# Patient Record
Sex: Female | Born: 1960 | Race: White | Hispanic: No | Marital: Married | Smoking: Never smoker
Health system: Southern US, Community
[De-identification: ages and names within clinical notes are randomized; demographics above are authoritative.]

## PROBLEM LIST (undated history)

## (undated) DIAGNOSIS — E039 Hypothyroidism, unspecified: Secondary | ICD-10-CM

---

## 1998-09-28 ENCOUNTER — Other Ambulatory Visit: Admission: RE | Admit: 1998-09-28 | Discharge: 1998-09-28 | Payer: Self-pay | Admitting: Obstetrics and Gynecology

## 2000-05-08 ENCOUNTER — Other Ambulatory Visit: Admission: RE | Admit: 2000-05-08 | Discharge: 2000-05-08 | Payer: Self-pay | Admitting: Obstetrics and Gynecology

## 2001-10-12 ENCOUNTER — Other Ambulatory Visit: Admission: RE | Admit: 2001-10-12 | Discharge: 2001-10-12 | Payer: Self-pay | Admitting: Obstetrics and Gynecology

## 2002-04-28 ENCOUNTER — Ambulatory Visit (HOSPITAL_COMMUNITY): Admission: RE | Admit: 2002-04-28 | Discharge: 2002-04-28 | Payer: Self-pay | Admitting: Obstetrics and Gynecology

## 2002-04-28 ENCOUNTER — Encounter (INDEPENDENT_AMBULATORY_CARE_PROVIDER_SITE_OTHER): Payer: Self-pay

## 2003-02-09 ENCOUNTER — Other Ambulatory Visit: Admission: RE | Admit: 2003-02-09 | Discharge: 2003-02-09 | Payer: Self-pay | Admitting: Obstetrics and Gynecology

## 2003-02-24 ENCOUNTER — Ambulatory Visit (HOSPITAL_COMMUNITY): Admission: RE | Admit: 2003-02-24 | Discharge: 2003-02-24 | Payer: Self-pay | Admitting: Gastroenterology

## 2003-02-24 ENCOUNTER — Encounter: Payer: Self-pay | Admitting: Gastroenterology

## 2003-04-07 ENCOUNTER — Ambulatory Visit (HOSPITAL_COMMUNITY): Admission: RE | Admit: 2003-04-07 | Discharge: 2003-04-07 | Payer: Self-pay | Admitting: Gastroenterology

## 2003-04-07 ENCOUNTER — Encounter: Payer: Self-pay | Admitting: Gastroenterology

## 2003-04-11 ENCOUNTER — Encounter: Payer: Self-pay | Admitting: Gastroenterology

## 2003-04-11 ENCOUNTER — Ambulatory Visit (HOSPITAL_COMMUNITY): Admission: RE | Admit: 2003-04-11 | Discharge: 2003-04-11 | Payer: Self-pay | Admitting: Gastroenterology

## 2004-05-02 ENCOUNTER — Other Ambulatory Visit: Admission: RE | Admit: 2004-05-02 | Discharge: 2004-05-02 | Payer: Self-pay | Admitting: Obstetrics and Gynecology

## 2005-06-28 ENCOUNTER — Ambulatory Visit: Payer: Self-pay | Admitting: Family Medicine

## 2008-05-27 ENCOUNTER — Encounter: Payer: Self-pay | Admitting: *Deleted

## 2010-05-18 ENCOUNTER — Encounter: Admission: RE | Admit: 2010-05-18 | Discharge: 2010-05-18 | Payer: Self-pay | Admitting: Family Medicine

## 2010-07-24 ENCOUNTER — Encounter: Admission: RE | Admit: 2010-07-24 | Discharge: 2010-07-24 | Payer: Self-pay | Admitting: Family Medicine

## 2011-04-05 NOTE — Op Note (Signed)
[facility]  Patient:    Isabella Khan, Isabella Khan Visit Number: [id number] MRN: [id number]          Service Type: DSU Location: [facility] Attending Physician:  Soledad Gerlach Dictated by:   Guy Sandifer Arleta Creek, M.D. Proc. Date: 04/28/02 Admit Date:  04/28/2002                             Operative Report  PREOPERATIVE DIAGNOSIS:       Menometrorrhagia.  POSTOPERATIVE DIAGNOSIS:      Menometrorrhagia.  OPERATION:                    1. Hysteroscopy with resectoscope.                               2. Dilatation and curettage.  SURGEON:                      Guy Sandifer. Arleta Creek, M.D.  ANESTHESIA:                   MAC with 1% Xylocaine paracervical block.  ESTIMATED BLOOD LOSS:         Less than 50 cc.  I&Os:                         Sorbitol distending media, 80 cc deficit.  INDICATIONS AND CONSENT:      The patient is a 50 year old married white female, G2, P2, husband status post vasectomy with increasingly heavy menstrual bleeding.  The details are dictated in the History and Physical. Hysteroscopy with resectoscope, dilatation and curettage have been discussed with the patient.  Potential risks and complications had been discussed preoperatively including, but not limited to infection, uterine perforation or organ damage, bleeding requiring transfusion of blood products with possible transfusion reaction, HIV and hepatitis acquisition, DVT, PE, pneumonia, hysterectomy, laparoscopy, and laparotomy.  All questions have been answered and consent is signed on the chart.  FINDINGS:                     The fallopian tube ostia identified bilaterally. There is a polypoid-type structure which is removed with the hysteroscope.  No other abnormal structures or vessels were noted.  DESCRIPTION OF PROCEDURE:     The patient was taken to the operating room with PAS hose on.  She was then placed in the dorsal supine position where MAC anesthesia has been  induced.  The patient urinated immediately prior to transfer to the operating room.  She was then placed in the dorsolithotomy position where she was prepped and draped in a sterile fashion.  A bivalve speculum was placed in the vagina and the anterior cervical lip was injected with 1% Xylocaine and grasped with a single tooth tenaculum.  Paracervical block was then placed at 2, 4, 5, 7, 8, and 10 oclock positions with approximately 20 cc of 1% Xylocaine.  The cervix actually requires little if any dilation up to a 27 Pratt dilator and a diagnostic hysteroscope was placed in the cervix and advanced under direct visualization using sorbitol distending media.  The above findings were noted.  The hysteroscope was withdrawn.  The cervix was dilated to a 29 Pratt dilator.  The resectoscope was then placed and the polypoid-type structures is resected without  difficulty.  The hysteroscope was then withdrawn and sharp curettage was carried out.  All products were sent to pathology.  The patient was then awakened and taken to the recovery room.  All counts were correct.  The patient was transferred in stable condition. Dictated by:   Guy Sandifer Arleta Creek, M.D. Attending Physician:  Soledad Gerlach DD:  04/28/02 TD:  04/30/02 Job: [id number]

## 2011-04-05 NOTE — H&P (Signed)
[facility]  Patient:    Isabella Khan, Isabella Khan Visit Number: [id number] MRN: [id number]          Service Type: DSU Location: [facility] Attending Physician:  Soledad Gerlach Dictated by:   Guy Sandifer Arleta Creek, M.D. Admit Date:  04/28/2002                           History and Physical  HISTORY OF PRESENT ILLNESS:   This patient is a 50 year old married white female gravida 2, para 2, whose husband is status post vasectomy.  Her menses have become much more predictable.  Sometimes she has extremely heavy flows with clotting.  On examination, the uterus is upper limits of normal size.  On ultrasound on December 02, 2001, in my office, the uterus measured 9.9 x 5.0 x 4.1 cm with a 6.8 mm endometrial stripe.  The right ovary contained normal follicles and the left ovary contained a 14 mm cyst that was hemorrhage.  On sonohistogram, there were two densities noted, one being 5 mm and the other being 2.3 mm in the endometrial cavity.  In addition to anterior wall thickness, endometrium was 3.9 mm.  In view of the continued irregular bleeding and the suggestion of possible abnormalities in the uterine cavity, hysteroscopy and D&C is discussed with the patient.  Potential risks and complications have been discussed preoperatively.  PAST MEDICAL HISTORY:         1. Hypothyroidism, status post partial                                  thyroidectomy.                               2. Bilateral deep venous thromboses during                                  pregnancy in 1996.  PAST SURGICAL HISTORY:        1. Partial thyroidectomy in 1976.                               2. Wisdom teeth extraction in 1996.  OBSTETRIC HISTORY:            Vaginal delivery x2.  MEDICATIONS:                  1. Synthroid 88 mcg per day.                               2. Paxil CR 20 mg per day.                               3. Excedrin p.r.n. headache.                               4.  Calcium and vitamins.  ALLERGIES:                    No known drug allergies.  FAMILY HISTORY:  Positive for migraine headache in the patients mother.  Lung cancer in maternal aunt.  Bone cancer in maternal grandfather and kidney cancer in maternal aunt.  Mitral valve prolapse in maternal grandmother and mother.  Chronic hypertension in mother.  Diabetes in paternal grandmother.  SOCIAL HISTORY:               The patient is an LPN.  She denies tobacco, alcohol, or drug abuse.  REVIEW OF SYSTEMS:            Negative except as above.  PHYSICAL EXAMINATION:  VITAL SIGNS:                  Height 5 feet and 5-1/2 inches.  Weight 126 pounds.  Blood pressure 100/64.  HEENT:                        Without thyromegaly.  LUNGS:                        Clear to auscultation.  HEART:                        Regular rate and rhythm.  BACK:                         Without CVA tenderness.  BREASTS:                      Without masses, retraction, or discharge.  ABDOMEN:                      Soft and nontender without masses.  PELVIC EXAMINATION:           Vulva, vagina, and cervix without lesions. Uterus is anteverted, normal in size, and quite mobile.  There is marked urethrovesical angle mobility and a small cystocele.  Adnexa nontender without masses.  Rectovaginal exam without masses.  EXTREMITIES:                  Grossly within normal limits.  NEUROLOGICAL:                 Exam grossly within normal limits.  ASSESSMENT:                   Menometrorrhagia.  PLAN:                         Hysteroscopy with possible resectoscope, and dilatation and curettage. Dictated by:   Guy Sandifer Arleta Creek, M.D. Attending Physician:  Soledad Gerlach DD:  04/27/02 TD:  04/27/02 Job: [id number]

## 2011-11-05 ENCOUNTER — Other Ambulatory Visit (HOSPITAL_COMMUNITY)
Admission: RE | Admit: 2011-11-05 | Discharge: 2011-11-05 | Disposition: A | Discharge status: Home / Self Care | Payer: 59 | Source: Ambulatory Visit | Attending: Family Medicine | Admitting: Family Medicine

## 2011-11-05 ENCOUNTER — Other Ambulatory Visit: Payer: Self-pay

## 2011-11-05 DIAGNOSIS — Z01419 Encounter for gynecological examination (general) (routine) without abnormal findings: Secondary | ICD-10-CM | POA: Insufficient documentation

## 2012-09-15 ENCOUNTER — Other Ambulatory Visit: Payer: Self-pay | Admitting: Family Medicine

## 2012-09-15 ENCOUNTER — Other Ambulatory Visit (HOSPITAL_COMMUNITY)
Admission: RE | Admit: 2012-09-15 | Discharge: 2012-09-15 | Disposition: A | Discharge status: Home / Self Care | Payer: 59 | Source: Ambulatory Visit | Attending: Family Medicine | Admitting: Family Medicine

## 2012-09-15 DIAGNOSIS — Z0000 Encounter for general adult medical examination without abnormal findings: Secondary | ICD-10-CM | POA: Insufficient documentation

## 2015-10-31 ENCOUNTER — Other Ambulatory Visit (HOSPITAL_COMMUNITY)
Admission: RE | Admit: 2015-10-31 | Discharge: 2015-10-31 | Disposition: A | Discharge status: Home / Self Care | Payer: No Typology Code available for payment source | Source: Ambulatory Visit | Attending: Family Medicine | Admitting: Family Medicine

## 2015-10-31 ENCOUNTER — Other Ambulatory Visit: Payer: Self-pay | Admitting: Family Medicine

## 2015-10-31 DIAGNOSIS — Z01419 Encounter for gynecological examination (general) (routine) without abnormal findings: Secondary | ICD-10-CM | POA: Insufficient documentation

## 2015-11-02 LAB — CYTOLOGY - PAP

## 2018-10-28 ENCOUNTER — Other Ambulatory Visit (HOSPITAL_COMMUNITY)
Admission: RE | Admit: 2018-10-28 | Discharge: 2018-10-28 | Disposition: A | Discharge status: Home / Self Care | Payer: No Typology Code available for payment source | Source: Ambulatory Visit | Attending: Internal Medicine | Admitting: Internal Medicine

## 2018-10-28 ENCOUNTER — Other Ambulatory Visit: Payer: Self-pay | Admitting: Internal Medicine

## 2018-10-28 DIAGNOSIS — Z01411 Encounter for gynecological examination (general) (routine) with abnormal findings: Secondary | ICD-10-CM | POA: Insufficient documentation

## 2018-10-30 LAB — CYTOLOGY - PAP
Diagnosis: NEGATIVE
HPV: NOT DETECTED

## 2019-12-28 DIAGNOSIS — E890 Postprocedural hypothyroidism: Secondary | ICD-10-CM | POA: Diagnosis not present

## 2020-01-22 ENCOUNTER — Ambulatory Visit: Payer: Self-pay | Attending: Internal Medicine

## 2020-01-22 DIAGNOSIS — Z23 Encounter for immunization: Secondary | ICD-10-CM | POA: Insufficient documentation

## 2020-01-22 NOTE — Progress Notes (Signed)
   Covid-19 Vaccination Clinic  Name:  CLAIR BARDWELL    MRN: [id number] DOB: [birth year]  01/22/2020  Ms. Brisbin was observed post Covid-19 immunization for 15 minutes without incident. She was provided with Vaccine Information Sheet and instruction to access the V-Safe system.   Ms. Zabawa was instructed to call 911 with any severe reactions post vaccine: Marland Kitchen Difficulty breathing  . Swelling of face and throat  . A fast heartbeat  . A bad rash all over body  . Dizziness and weakness   Immunizations Administered    Name Date Dose VIS Date Route   Pfizer COVID-19 Vaccine 01/22/2020  1:19 PM 0.3 mL 10/29/2019 Intramuscular   Manufacturer: [employer], [employer]   Lot: [id number]   NDC: [address]-2

## 2020-02-12 ENCOUNTER — Ambulatory Visit: Payer: Self-pay | Attending: Internal Medicine

## 2020-02-12 DIAGNOSIS — Z23 Encounter for immunization: Secondary | ICD-10-CM

## 2020-02-12 NOTE — Progress Notes (Signed)
   Covid-19 Vaccination Clinic  Name:  MAURENE HOLLIN    MRN: [id number] DOB: 22-Apr-1961  02/12/2020  Ms. Reinertsen was observed post Covid-19 immunization for 15 minutes without incident. She was provided with Vaccine Information Sheet and instruction to access the V-Safe system.   Ms. Mian was instructed to call 911 with any severe reactions post vaccine: Marland Kitchen Difficulty breathing  . Swelling of face and throat  . A fast heartbeat  . A bad rash all over body  . Dizziness and weakness   Immunizations Administered    Name Date Dose VIS Date Route   Pfizer COVID-19 Vaccine 02/12/2020 11:18 AM 0.3 mL 10/29/2019 Intramuscular   Manufacturer: [employer], [employer]   Lot: [id number]   NDC: [address]-7

## 2020-02-22 ENCOUNTER — Ambulatory Visit: Payer: Self-pay

## 2020-06-07 DIAGNOSIS — Z13220 Encounter for screening for lipoid disorders: Secondary | ICD-10-CM | POA: Diagnosis not present

## 2020-06-07 DIAGNOSIS — R3129 Other microscopic hematuria: Secondary | ICD-10-CM | POA: Diagnosis not present

## 2020-06-07 DIAGNOSIS — Z0000 Encounter for general adult medical examination without abnormal findings: Secondary | ICD-10-CM | POA: Diagnosis not present

## 2020-06-07 DIAGNOSIS — E890 Postprocedural hypothyroidism: Secondary | ICD-10-CM | POA: Diagnosis not present

## 2020-07-25 DIAGNOSIS — Z20822 Contact with and (suspected) exposure to covid-19: Secondary | ICD-10-CM | POA: Diagnosis not present

## 2021-05-23 DIAGNOSIS — Z20822 Contact with and (suspected) exposure to covid-19: Secondary | ICD-10-CM | POA: Diagnosis not present

## 2021-05-30 DIAGNOSIS — Z1231 Encounter for screening mammogram for malignant neoplasm of breast: Secondary | ICD-10-CM | POA: Diagnosis not present

## 2021-06-13 DIAGNOSIS — E890 Postprocedural hypothyroidism: Secondary | ICD-10-CM | POA: Diagnosis not present

## 2021-06-13 DIAGNOSIS — R002 Palpitations: Secondary | ICD-10-CM | POA: Diagnosis not present

## 2021-06-13 DIAGNOSIS — Z13220 Encounter for screening for lipoid disorders: Secondary | ICD-10-CM | POA: Diagnosis not present

## 2021-06-13 DIAGNOSIS — Z0000 Encounter for general adult medical examination without abnormal findings: Secondary | ICD-10-CM | POA: Diagnosis not present

## 2021-06-13 DIAGNOSIS — M810 Age-related osteoporosis without current pathological fracture: Secondary | ICD-10-CM | POA: Diagnosis not present

## 2021-08-13 DIAGNOSIS — D72819 Decreased white blood cell count, unspecified: Secondary | ICD-10-CM | POA: Diagnosis not present

## 2021-10-14 DIAGNOSIS — J069 Acute upper respiratory infection, unspecified: Secondary | ICD-10-CM | POA: Diagnosis not present

## 2021-10-14 DIAGNOSIS — J208 Acute bronchitis due to other specified organisms: Secondary | ICD-10-CM | POA: Diagnosis not present

## 2021-10-14 DIAGNOSIS — H6122 Impacted cerumen, left ear: Secondary | ICD-10-CM | POA: Diagnosis not present

## 2022-01-08 DIAGNOSIS — Z20822 Contact with and (suspected) exposure to covid-19: Secondary | ICD-10-CM | POA: Diagnosis not present

## 2022-03-19 ENCOUNTER — Emergency Department (HOSPITAL_BASED_OUTPATIENT_CLINIC_OR_DEPARTMENT_OTHER)
Admission: EM | Admit: 2022-03-19 | Discharge: 2022-03-19 | Disposition: A | Discharge status: Home / Self Care | Payer: BC Managed Care – PPO | Attending: Emergency Medicine | Admitting: Emergency Medicine

## 2022-03-19 ENCOUNTER — Encounter (HOSPITAL_BASED_OUTPATIENT_CLINIC_OR_DEPARTMENT_OTHER): Payer: Self-pay | Admitting: Emergency Medicine

## 2022-03-19 ENCOUNTER — Emergency Department (HOSPITAL_BASED_OUTPATIENT_CLINIC_OR_DEPARTMENT_OTHER): Payer: BC Managed Care – PPO | Admitting: Radiology

## 2022-03-19 ENCOUNTER — Other Ambulatory Visit: Payer: Self-pay

## 2022-03-19 DIAGNOSIS — W108XXA Fall (on) (from) other stairs and steps, initial encounter: Secondary | ICD-10-CM | POA: Diagnosis not present

## 2022-03-19 DIAGNOSIS — S82842A Displaced bimalleolar fracture of left lower leg, initial encounter for closed fracture: Secondary | ICD-10-CM | POA: Insufficient documentation

## 2022-03-19 DIAGNOSIS — M7989 Other specified soft tissue disorders: Secondary | ICD-10-CM | POA: Insufficient documentation

## 2022-03-19 DIAGNOSIS — S99912A Unspecified injury of left ankle, initial encounter: Secondary | ICD-10-CM | POA: Diagnosis not present

## 2022-03-19 HISTORY — DX: Hypothyroidism, unspecified: E03.9

## 2022-03-19 MED ORDER — HYDROCODONE-ACETAMINOPHEN 5-325 MG PO TABS: 1.0000 | ORAL_TABLET | ORAL | 0 refills | Status: AC | PRN

## 2022-03-19 MED ORDER — HYDROCODONE-ACETAMINOPHEN 5-325 MG PO TABS
1.0000 | ORAL_TABLET | Freq: Once | ORAL | Status: AC
  Administered 2022-03-19: 1 via ORAL
  Filled 2022-03-19: qty 1

## 2022-03-19 NOTE — Discharge Instructions (Signed)
Keep the leg elevated and do not put any weight on it.  Take the pain medication as prescribed.  Call Dr. [name]' office for an appointment tomorrow after 8 AM.  Return to the ED with worsening pain, weakness, numbness, tingling, other concerns ?

## 2022-03-19 NOTE — ED Provider Notes (Signed)
?[facility] EM[facility] DEPT ?Provider Note ? ? ?CSN: [id number] ?Arrival date & time: 03/19/22  1812 ? ?  ? ?History ? ?Chief Complaint  ?Patient presents with  ? Ankle Injury  ? ? ?Isabella Khan is a 61 y.o. female. ? ?Patient states she injured her left ankle while coming down some stairs and missing the last step.  Her left ankle inverted and she fell to the ground and her elbows.  Not hit her head or lose consciousness.  Complains of pain and swelling to her left ankle diffusely.  No numbness or tingling.  Denies any blood thinner use.  No neck or back pain.  No chest pain shortness of breath or abdominal pain.  Unable to put any weight on her left leg.  No breaks in the skin.  Nothing is numb or tingly ? ?The history is provided by the patient.  ? ?  ? ?Home Medications ?Prior to Admission medications   ?Not on File  ?   ? ?Allergies    ?Patient has no known allergies.   ? ?Review of Systems   ?Review of Systems  ?Constitutional:  Negative for activity change, appetite change and fever.  ?HENT:  Negative for congestion.   ?Respiratory:  Negative for cough, chest tightness and shortness of breath.   ?Cardiovascular:  Negative for chest pain.  ?Gastrointestinal:  Negative for abdominal pain, nausea and vomiting.  ?Genitourinary:  Negative for dysuria and hematuria.  ?Musculoskeletal:  Positive for arthralgias and myalgias.  ?Skin:  Negative for rash.  ?Neurological:  Negative for dizziness, weakness and headaches.  ? all other systems are negative except as noted in the HPI and PMH.  ? ?Physical Exam ?Updated Vital Signs ?BP (!) 139/96 (BP Location: Left Arm)   Pulse 67   Temp 98.3 ?F (36.8 ?C)   Resp 14   Ht 5\' 4"  (1.626 m)   Wt 56.7 kg   SpO2 100%   BMI 21.46 kg/m?  ?Physical Exam ?Vitals and nursing note reviewed.  ?Constitutional:   ?   General: She is not in acute distress. ?   Appearance: She is well-developed.  ?HENT:  ?   Head: Normocephalic and atraumatic.  ?   Mouth/Throat:  ?    Pharynx: No oropharyngeal exudate.  ?Eyes:  ?   Conjunctiva/sclera: Conjunctivae normal.  ?   Pupils: Pupils are equal, round, and reactive to light.  ?Neck:  ?   Comments: No meningismus. ?Cardiovascular:  ?   Rate and Rhythm: Normal rate and regular rhythm.  ?   Heart sounds: Normal heart sounds. No murmur heard. ?Pulmonary:  ?   Effort: Pulmonary effort is normal. No respiratory distress.  ?   Breath sounds: Normal breath sounds.  ?Abdominal:  ?   Palpations: Abdomen is soft.  ?   Tenderness: There is no abdominal tenderness. There is no guarding or rebound.  ?Musculoskeletal:     ?   General: Swelling, tenderness and deformity present.  ?   Cervical back: Normal range of motion and neck supple.  ?   Comments: Diffuse swelling of left ankle.  Compartments are soft.  Range of motion diminished due to pain.  Able to wiggle toes.  Intact DP and PT pulse.  No proximal fibular tenderness  ?Skin: ?   General: Skin is warm.  ?Neurological:  ?   Mental Status: She is alert and oriented to person, place, and time.  ?   Cranial Nerves: No cranial nerve deficit.  ?  Motor: No abnormal muscle tone.  ?   Coordination: Coordination normal.  ?   Comments:  5/5 strength throughout. CN 2-12 intact.Equal grip strength.   ?Psychiatric:     ?   Behavior: Behavior normal.  ? ? ?ED Results / Procedures / Treatments   ?Labs ?(all labs ordered are listed, but only abnormal results are displayed) ?Labs Reviewed - No data to display ? ?EKG ?None ? ?Radiology ?DG Ankle Complete Left ? ?Result Date: 03/19/2022 ?CLINICAL DATA:  Swelling, twisted ankle. EXAM: LEFT ANKLE COMPLETE - 3+ VIEW COMPARISON:  None. FINDINGS: There is an acute oblique fracture through the medial malleolus, nondisplaced. There is also likely a nondisplaced acute fracture through the distal fibula just above the level of the ankle mortise. Joint spaces are well maintained. There is marked soft tissue swelling surrounding the ankle. IMPRESSION: 1. Acute nondisplaced  medial malleolar fracture. 2. Likely acute nondisplaced fracture of the distal fibula. Electronically Signed   By: Darliss Cheney M.D.   On: 03/19/2022 19:08   ? ?Procedures ?Procedures  ? ? ?Medications Ordered in ED ?Medications  ?HYDROcodone-acetaminophen (NORCO/VICODIN) 5-325 MG per tablet 1 tablet (1 tablet Oral Given 03/19/22 2213)  ? ? ?ED Course/ Medical Decision Making/ A&P ?  ?                        ?Medical Decision Making ?Amount and/or Complexity of Data Reviewed ?Radiology: ordered. ? ?Risk ?Prescription drug management. ? ? ?Mechanical fall with left ankle pain.  No head injury.  Neurovascularly intact. ? ?Xray is consistent with nondisplaced bimalleolar fracture. ?Results reviewed and interpreted by me ? ?Discussed with Dr. [name] of orthopedics.  He agrees with splinting as is, splint, ice, elevation and crutches.  He will see the patient after 8 AM tomorrow in the office.  She will call for an appointment ? ?Discussed with patient and husband.  Call orthopedics for an appointment in the morning.  Return precautions given ? ? ? ? ? ? ? ?Final Clinical Impression(s) / ED Diagnoses ?Final diagnoses:  ?Bimalleolar ankle fracture, left, closed, initial encounter  ? ? ?Rx / DC Orders ?ED Discharge Orders   ? ? None  ? ?  ? ? ?  ?Glynn Octave, MD ?03/19/22 2314 ? ?

## 2022-03-19 NOTE — ED Triage Notes (Signed)
Pt arrives to ED with c/o left sided ankle injury that occurred today. Pt reports she landed on her ankle sideways after missing a step.  ?

## 2022-03-20 ENCOUNTER — Telehealth: Payer: Self-pay | Admitting: Radiology

## 2022-03-20 NOTE — Telephone Encounter (Signed)
Patient left voicemail on triage line stating she was seen in ED last night for ankle fracture and needed appointment to see Dr. [name] today. I spoke with Dr. [name] who advised to continue NWB, elevate, and put patient on schedule for Friday. I called patient and advised. Appointment scheduled for Friday afternoon. ?

## 2022-03-22 ENCOUNTER — Encounter: Payer: Self-pay | Admitting: Orthopaedic Surgery

## 2022-03-22 ENCOUNTER — Ambulatory Visit: Payer: BC Managed Care – PPO | Admitting: Orthopaedic Surgery

## 2022-03-22 DIAGNOSIS — S8253XA Displaced fracture of medial malleolus of unspecified tibia, initial encounter for closed fracture: Secondary | ICD-10-CM | POA: Insufficient documentation

## 2022-03-22 DIAGNOSIS — S8255XA Nondisplaced fracture of medial malleolus of left tibia, initial encounter for closed fracture: Secondary | ICD-10-CM

## 2022-03-22 NOTE — Progress Notes (Signed)
? ?Office Visit Note ?  ?Patient: Isabella Khan           ?Date of Birth: 1960/12/31           ?MRN: [id number] ?Visit Date: 03/22/2022 ?             ?Requested by: [name], MD ?[address] ?[place],  [place] [address] ?PCP: [name], MD ? ? ?Assessment & Plan: ?Visit Diagnoses: Nondisplaced closed left medial malleolar ankle fracture ? ?Plan: Patient will return in 1 week for short leg fiberglass cast application in neutral position and will remain nonweightbearing.  She will follow-up with Dr. [name] in 5 weeks for cast off x-rays out of cast and likely cam boot.  I discussed with her I do not see a definite fracture of the lateral malleolus.Global.  ? ?Follow-Up Instructions: No follow-ups on file.  ? ?Orders:  ?No orders of the defined types were placed in this encounter. ? ?No orders of the defined types were placed in this encounter. ? ? ? ? Procedures: ?No procedures performed ? ? ?Clinical Data: ?No additional findings. ? ? ?Subjective: ?Chief Complaint  ?Patient presents with  ? Left Ankle - Pain  ?  Fall 03/19/2022  ? ? ?HPI 61 year old female had a fall 03/19/2022 with injury to left ankle inability to ambulate.  She missed the last step coming down when she was talking with her husband.  She has had ibuprofen also some Percocet.  Pain medicine sparingly.  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  She has had some swelling she has crutches some difficulty with crutches and is looking into a knee roller. ? ?Review of Systems all other systems noncontributory to HPI. ? ? ?Objective: ?Vital Signs: BP 121/76   Pulse 69   Ht 5\' 4"  (1.626 m)   Wt 125 lb (56.7 kg)   BMI 21.46 kg/m?  ? ?Physical Exam ?Constitutional:   ?   Appearance: She is well-developed.  ?HENT:  ?   Head: Normocephalic.  ?   Right Ear: External ear normal.  ?   Left Ear: External ear normal. There is no impacted cerumen.  ?Eyes:  ?   Pupils: Pupils are equal, round, and reactive to light.  ?Neck:  ?   Thyroid:  No thyromegaly.  ?   Trachea: No tracheal deviation.  ?Cardiovascular:  ?   Rate and Rhythm: Normal rate.  ?Pulmonary:  ?   Effort: Pulmonary effort is normal.  ?Abdominal:  ?   Palpations: Abdomen is soft.  ?Musculoskeletal:  ?   Cervical back: No rigidity.  ?Skin: ?   General: Skin is warm and dry.  ?Neurological:  ?   Mental Status: She is alert and oriented to person, place, and time.  ?Psychiatric:     ?   Behavior: Behavior normal.  ? ? ?Ortho Exam patient is a well-padded splint proximal portion had moleskin added since it was rubbing some into her proximal calf posteriorly.  Sensation of the foot is intact. ? ?Specialty Comments:  ?No specialty comments available. ? ?Imaging: ?Narrative & Impression  ?CLINICAL DATA:  Swelling, twisted ankle. ?  ?EXAM: ?LEFT ANKLE COMPLETE - 3+ VIEW ?  ?COMPARISON:  None. ?  ?FINDINGS: ?There is an acute oblique fracture through the medial malleolus, ?nondisplaced. ?  ?There is also likely a nondisplaced acute fracture through the ?distal fibula just above the level of the ankle mortise. ?  ?Joint spaces are well maintained. There is marked soft tissue ?  swelling surrounding the ankle. ?  ?IMPRESSION: ?1. Acute nondisplaced medial malleolar fracture. ?2. Likely acute nondisplaced fracture of the distal fibula. ?  ?  ?Electronically Signed ?  By: Darliss Cheney M.D. ?  On: 03/19/2022 19:08  ? ? ? ?PMFS History: ?Patient Active Problem List  ? Diagnosis Date Noted  ? Fracture of medial malleolus 03/22/2022  ? ?Past Medical History:  ?Diagnosis Date  ? Hypothyroid   ?  ?No family history on file.  ?No past surgical history on file. ?Social History  ? ?Occupational History  ? Not on file  ?Tobacco Use  ? Smoking status: Never  ? Smokeless tobacco: Never  ?Substance and Sexual Activity  ? Alcohol use: Not on file  ? Drug use: Not on file  ? Sexual activity: Not on file  ? ? ? ? ? ? ?

## 2022-03-29 ENCOUNTER — Ambulatory Visit (INDEPENDENT_AMBULATORY_CARE_PROVIDER_SITE_OTHER): Payer: BC Managed Care – PPO | Admitting: Radiology

## 2022-03-29 ENCOUNTER — Encounter: Payer: Self-pay | Admitting: Radiology

## 2022-03-29 VITALS — Ht 64.0 in | Wt 125.0 lb

## 2022-03-29 DIAGNOSIS — S8255XA Nondisplaced fracture of medial malleolus of left tibia, initial encounter for closed fracture: Secondary | ICD-10-CM

## 2022-03-29 NOTE — Progress Notes (Signed)
Patient returns for short leg cast application. She continues to have swelling in her foot and toes. Short leg cast applied. Advised to continue elevation with the leg higher than the heart. Patient to follow up in the office in 5 weeks per Dr. [name] office note. She will call if problems or concerns prior to that visit. ?

## 2022-05-07 ENCOUNTER — Ambulatory Visit: Payer: BC Managed Care – PPO | Admitting: Orthopaedic Surgery

## 2022-05-07 ENCOUNTER — Encounter: Payer: Self-pay | Admitting: Orthopaedic Surgery

## 2022-05-07 ENCOUNTER — Ambulatory Visit: Payer: Self-pay

## 2022-05-07 VITALS — BP 111/72 | HR 66 | Ht 64.0 in | Wt 125.0 lb

## 2022-05-07 DIAGNOSIS — S8255XA Nondisplaced fracture of medial malleolus of left tibia, initial encounter for closed fracture: Secondary | ICD-10-CM | POA: Diagnosis not present

## 2022-05-07 NOTE — Progress Notes (Signed)
   Post-Op Visit Note   Patient: Isabella Khan           Date of Birth: 1961/08/09           MRN: [id number] Visit Date: 05/07/2022 PCP: [name], MD   Assessment & Plan: Cast removal follow-up nondisplaced medial malleolar fracture.  There is questionable fibular fracture but I do not see any changes and I think she only had a medial malleolar fracture although she continues have some discomfort and tenderness laterally over the fibula.  She can stand and weight-bear with her crutches without significant pain we will place her in a cam boot and she can begin progressive weightbearing starting at 25 to 50% and progress as tolerated.  She has a trip to [place] planned in September and her goal is to be able to walk all the activities on her trip as planned.  Recheck 4 weeks.  No x-ray needed on return.  X-rays show good healing of the medial malleolar fracture.  Chief Complaint:  Chief Complaint  Patient presents with   Left Ankle - Fracture, Follow-up   Visit Diagnoses:  1. Closed nondisplaced fracture of medial malleolus of left tibia, initial encounter     Plan:   Follow-Up Instructions: Return in about 4 weeks (around 06/04/2022).   Orders:  Orders Placed This Encounter  Procedures   XR Ankle Complete Left   No orders of the defined types were placed in this encounter.   Imaging: No results found.  PMFS History: Patient Active Problem List   Diagnosis Date Noted   Fracture of medial malleolus 03/22/2022   Past Medical History:  Diagnosis Date   Hypothyroid     History reviewed. No pertinent family history.  History reviewed. No pertinent surgical history. Social History   Occupational History   Not on file  Tobacco Use   Smoking status: Never   Smokeless tobacco: Never  Substance and Sexual Activity   Alcohol use: Not on file   Drug use: Not on file   Sexual activity: Not on file

## 2022-05-07 NOTE — Progress Notes (Deleted)
   Office Visit Note   Patient: Isabella Khan           Date of Birth: Dec 21, 1960           MRN: [id number] Visit Date: 05/07/2022              Requested by: [name], MD [address] [place],  [place] [address] PCP: [name], MD   Assessment & Plan: Visit Diagnoses:  1. Closed nondisplaced fracture of medial malleolus of left tibia, initial encounter     Plan: ***  Follow-Up Instructions: No follow-ups on file.   Orders:  Orders Placed This Encounter  Procedures   XR Ankle Complete Left   No orders of the defined types were placed in this encounter.     Procedures: No procedures performed   Clinical Data: No additional findings.   Subjective: Chief Complaint  Patient presents with   Left Ankle - Fracture, Follow-up    HPI  Review of Systems   Objective: Vital Signs: BP 111/72   Pulse 66   Ht 5\' 4"  (1.626 m)   Wt 125 lb (56.7 kg)   BMI 21.46 kg/m   Physical Exam  Ortho Exam  Specialty Comments:  No specialty comments available.  Imaging: No results found.   PMFS History: Patient Active Problem List   Diagnosis Date Noted   Fracture of medial malleolus 03/22/2022   Past Medical History:  Diagnosis Date   Hypothyroid     History reviewed. No pertinent family history.  History reviewed. No pertinent surgical history. Social History   Occupational History   Not on file  Tobacco Use   Smoking status: Never   Smokeless tobacco: Never  Substance and Sexual Activity   Alcohol use: Not on file   Drug use: Not on file   Sexual activity: Not on file

## 2022-05-16 ENCOUNTER — Encounter: Payer: Self-pay | Admitting: Orthopaedic Surgery

## 2022-05-17 ENCOUNTER — Telehealth: Payer: Self-pay

## 2022-05-17 ENCOUNTER — Other Ambulatory Visit: Payer: Self-pay

## 2022-05-17 ENCOUNTER — Ambulatory Visit (HOSPITAL_COMMUNITY)
Admission: RE | Admit: 2022-05-17 | Discharge: 2022-05-17 | Disposition: A | Discharge status: Home / Self Care | Payer: BC Managed Care – PPO | Source: Ambulatory Visit | Attending: Orthopaedic Surgery | Admitting: Orthopaedic Surgery

## 2022-05-17 DIAGNOSIS — S8255XA Nondisplaced fracture of medial malleolus of left tibia, initial encounter for closed fracture: Secondary | ICD-10-CM | POA: Diagnosis not present

## 2022-05-17 NOTE — Telephone Encounter (Signed)
Appt scheduled today at [facility] radiology at 3pm, pt is aware via phone and mychart

## 2022-05-17 NOTE — Telephone Encounter (Signed)
Ordered scan

## 2022-05-17 NOTE — Telephone Encounter (Signed)
Vascular called and stated pt was negative for acute DVT but does have a chronic DVT. Discussed with Fayrene Fearing. Advised pt to start on 325mg  aspirin, elevate and use compression stockings. I scheduled for her to followup on wednesday

## 2022-05-17 NOTE — Progress Notes (Signed)
Left lower extremity venous duplex has been completed. Preliminary results can be found in CV Proc through chart review.  Results were given to Autumn at Dr. [name]' office.  05/17/22 3:26 PM Olen Cordial RVT

## 2022-05-22 ENCOUNTER — Ambulatory Visit: Payer: BC Managed Care – PPO | Admitting: Orthopaedic Surgery

## 2022-05-22 VITALS — BP 135/90 | HR 67

## 2022-05-22 DIAGNOSIS — Z8759 Personal history of other complications of pregnancy, childbirth and the puerperium: Secondary | ICD-10-CM

## 2022-05-22 DIAGNOSIS — Z86718 Personal history of other venous thrombosis and embolism: Secondary | ICD-10-CM

## 2022-05-22 DIAGNOSIS — S8255XD Nondisplaced fracture of medial malleolus of left tibia, subsequent encounter for closed fracture with routine healing: Secondary | ICD-10-CM

## 2022-05-22 NOTE — Progress Notes (Unsigned)
   Office Visit Note   Patient: Isabella Khan           Date of Birth: [birth year]           MRN: [id number] Visit Date: 05/22/2022              Requested by: [name], MD [address] [place],  [place] [address] PCP: [name], MD   Assessment & Plan: Visit Diagnoses: No diagnosis found.  Plan: ***  Follow-Up Instructions: Return in about 1 month (around 06/22/2022).   Orders:  No orders of the defined types were placed in this encounter.  No orders of the defined types were placed in this encounter.     Procedures: No procedures performed   Clinical Data: No additional findings.   Subjective: Chief Complaint  Patient presents with   Left Ankle - Follow-up    HPI  Review of Systems   Objective: Vital Signs: BP 135/90   Pulse 67   Physical Exam  Ortho Exam  Specialty Comments:  No specialty comments available.  Imaging: No results found.   PMFS History: Patient Active Problem List   Diagnosis Date Noted   Fracture of medial malleolus 03/22/2022   Past Medical History:  Diagnosis Date   Hypothyroid     No family history on file.  No past surgical history on file. Social History   Occupational History   Not on file  Tobacco Use   Smoking status: Never   Smokeless tobacco: Never  Substance and Sexual Activity   Alcohol use: Not on file   Drug use: Not on file   Sexual activity: Not on file

## 2022-05-23 NOTE — Progress Notes (Signed)
   Post-Op Visit Note   Patient: Isabella Khan           Date of Birth: 1961/08/07           MRN: [id number] Visit Date: 05/22/2022 PCP: [name], MD   Assessment & Plan: Follow-up minimal alar fracture left treated conservatively nondisplaced with initial treatment 03/22/2022.  Patient had history of DVT with pregnancy and Doppler test showed chronic posterior tib changes but no acute DVT.  She has been recommended to just take aspirin daily.  Teds applied she can use 1 tablet on her symptomatic left lower extremity which still has slight swelling and some discoloration when it is in dependent position.  Medial malleolus palpation is minimally tender.  She is is frustrated she is not getting better faster.  We discussed she is making progress as expected.  She has the cam boot and 1 crutch currently.  Global.  Chief Complaint:  Chief Complaint  Patient presents with   Left Ankle - Follow-up   Visit Diagnoses:  1. Closed nondisplaced fracture of medial malleolus of left tibia with routine healing, subsequent encounter   2. History of maternal deep vein thrombosis (DVT)     Plan: Continue with progressive ambulation.  TED hose left lower extremity only.  Recheck 1 month.  Follow-Up Instructions: Return in about 1 month (around 06/22/2022).   Orders:  No orders of the defined types were placed in this encounter.  No orders of the defined types were placed in this encounter.   Imaging: No results found.  PMFS History: Patient Active Problem List   Diagnosis Date Noted   History of maternal deep vein thrombosis (DVT) 05/22/2022   Fracture of medial malleolus 03/22/2022   Past Medical History:  Diagnosis Date   Hypothyroid     No family history on file.  No past surgical history on file. Social History   Occupational History   Not on file  Tobacco Use   Smoking status: Never   Smokeless tobacco: Never  Substance and Sexual Activity   Alcohol use: Not on  file   Drug use: Not on file   Sexual activity: Not on file

## 2022-06-04 ENCOUNTER — Ambulatory Visit: Payer: BC Managed Care – PPO | Admitting: Orthopaedic Surgery

## 2022-06-05 DIAGNOSIS — Z1231 Encounter for screening mammogram for malignant neoplasm of breast: Secondary | ICD-10-CM | POA: Diagnosis not present

## 2022-06-12 DIAGNOSIS — R922 Inconclusive mammogram: Secondary | ICD-10-CM | POA: Diagnosis not present

## 2022-06-18 ENCOUNTER — Ambulatory Visit: Payer: BC Managed Care – PPO | Admitting: Orthopaedic Surgery

## 2022-06-18 ENCOUNTER — Encounter: Payer: Self-pay | Admitting: Orthopaedic Surgery

## 2022-06-18 VITALS — BP 108/74 | HR 73 | Ht 64.0 in | Wt 125.0 lb

## 2022-06-18 DIAGNOSIS — S8254XD Nondisplaced fracture of medial malleolus of right tibia, subsequent encounter for closed fracture with routine healing: Secondary | ICD-10-CM

## 2022-06-18 DIAGNOSIS — S8255XD Nondisplaced fracture of medial malleolus of left tibia, subsequent encounter for closed fracture with routine healing: Secondary | ICD-10-CM

## 2022-06-18 NOTE — Progress Notes (Unsigned)
Office Visit Note   Patient: Isabella Khan           Date of Birth: 1961-05-31           MRN: [id number] Visit Date: 06/18/2022              Requested by: [name], MD [address] [place],  [place] [address] PCP: [name], MD   Assessment & Plan: Visit Diagnoses: No diagnosis found.  Plan: Patient is getting ready go to the country with her husband will be doing a lot of walking.  Lace up [place] applied that she can use as needed.  She can return after vacation if she is continuing to have problems.  Follow-Up Instructions: Return in about 10 weeks (around 08/27/2022).   Orders:  No orders of the defined types were placed in this encounter.  No orders of the defined types were placed in this encounter.     Procedures: No procedures performed   Clinical Data: No additional findings.   Subjective: Chief Complaint  Patient presents with   Right Ankle - Fracture, Follow-up    HPI 61 year old female seen for week follow-up left ankle fracture.  Patient had nondisplaced medial malleolar fracture from fall on 03/19/2022.  Patient still has some mild swelling in her foot has some problems getting her shoe on.  She is still taking some aspirin wearing support stocking.  She still has a little bit more aching pain lateral than medial.  She had a Doppler test which showed some chronic posterior tibial vein changes with no acute DVT.  Review of Systems updated unchanged.   Objective: Vital Signs: BP 108/74   Pulse 73   Ht 5\' 4"  (1.626 m)   Wt 125 lb (56.7 kg)   BMI 21.46 kg/m   Physical Exam Constitutional:      Appearance: She is well-developed.  HENT:     Head: Normocephalic.     Right Ear: External ear normal.     Left Ear: External ear normal. There is no impacted cerumen.  Eyes:     Pupils: Pupils are equal, round, and reactive to light.  Neck:     Thyroid: No thyromegaly.     Trachea: No tracheal deviation.   Cardiovascular:     Rate and Rhythm: Normal rate.  Pulmonary:     Effort: Pulmonary effort is normal.  Abdominal:     Palpations: Abdomen is soft.  Musculoskeletal:     Cervical back: No rigidity.  Skin:    General: Skin is warm and dry.  Neurological:     Mental Status: She is alert and oriented to person, place, and time.  Psychiatric:        Behavior: Behavior normal.     Ortho Exam patient has slight tenderness laterally over anterior talofibular ligament.  No subluxation of peroneal tendon peroneal tendon takes good resistance no tenderness over the medial malleolus.  Trace forefoot swelling remains in comparison to the opposite foot.  Patient is ambulatory without significant limp.  Specialty Comments:  No specialty comments available.  Imaging: No results found.   PMFS History: Patient Active Problem List   Diagnosis Date Noted   History of maternal deep vein thrombosis (DVT) 05/22/2022   Fracture of medial malleolus 03/22/2022   Past Medical History:  Diagnosis Date   Hypothyroid     History reviewed. No pertinent family history.  History reviewed. No pertinent surgical history. Social History   Occupational History  Not on file  Tobacco Use   Smoking status: Never   Smokeless tobacco: Never  Substance and Sexual Activity   Alcohol use: Not on file   Drug use: Not on file   Sexual activity: Not on file

## 2022-07-11 IMAGING — DX DG ANKLE COMPLETE 3+V*L*
3 series · 3 of 3 positions shown · non-contrast
Comparison: None.

CLINICAL DATA: Swelling, twisted ankle.

EXAM:
LEFT ANKLE COMPLETE - 3+ VIEW

[ankle ap]
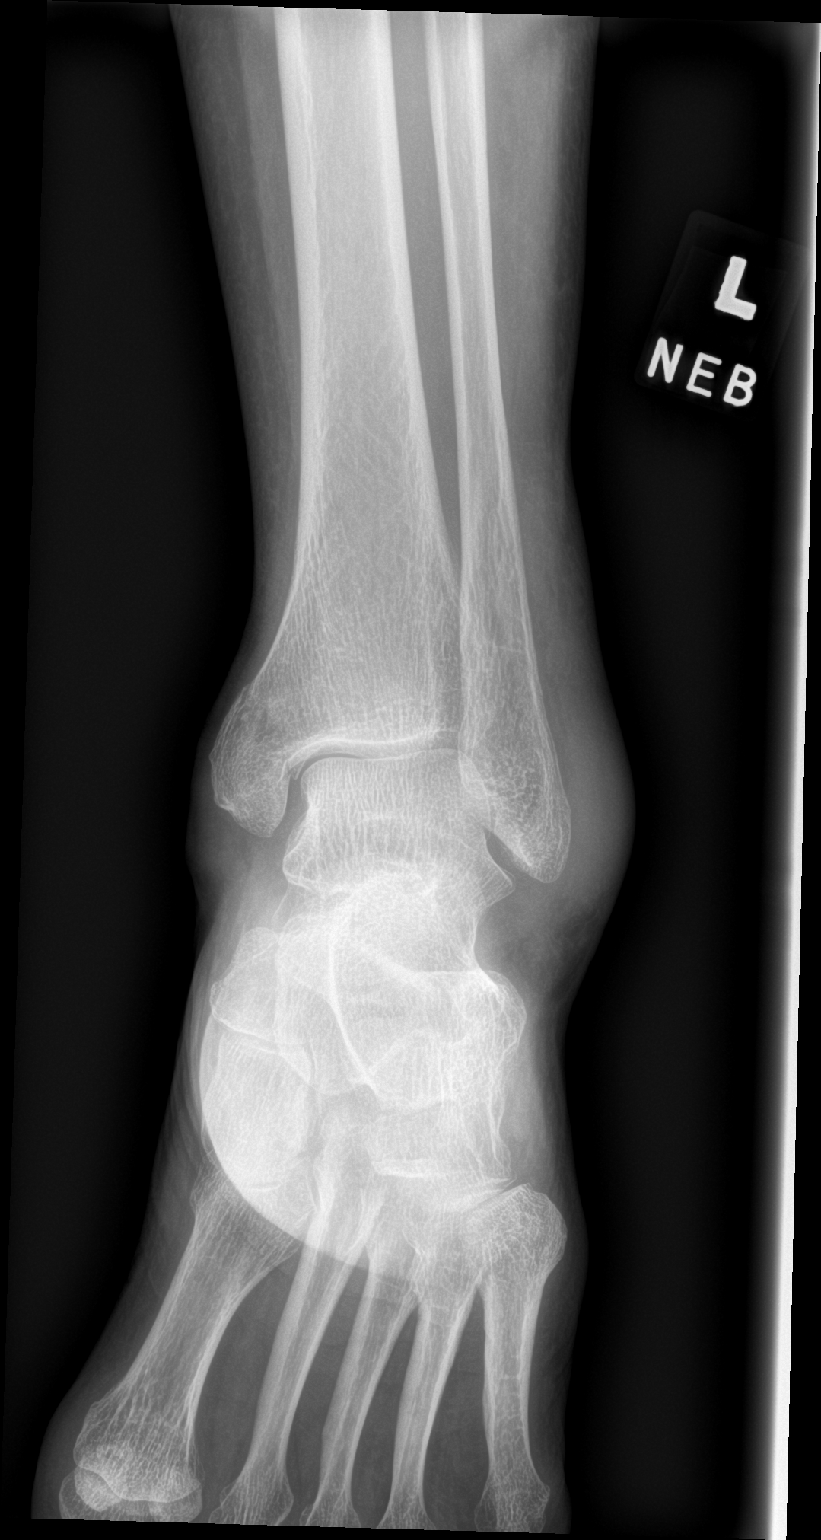

[ankle obl]
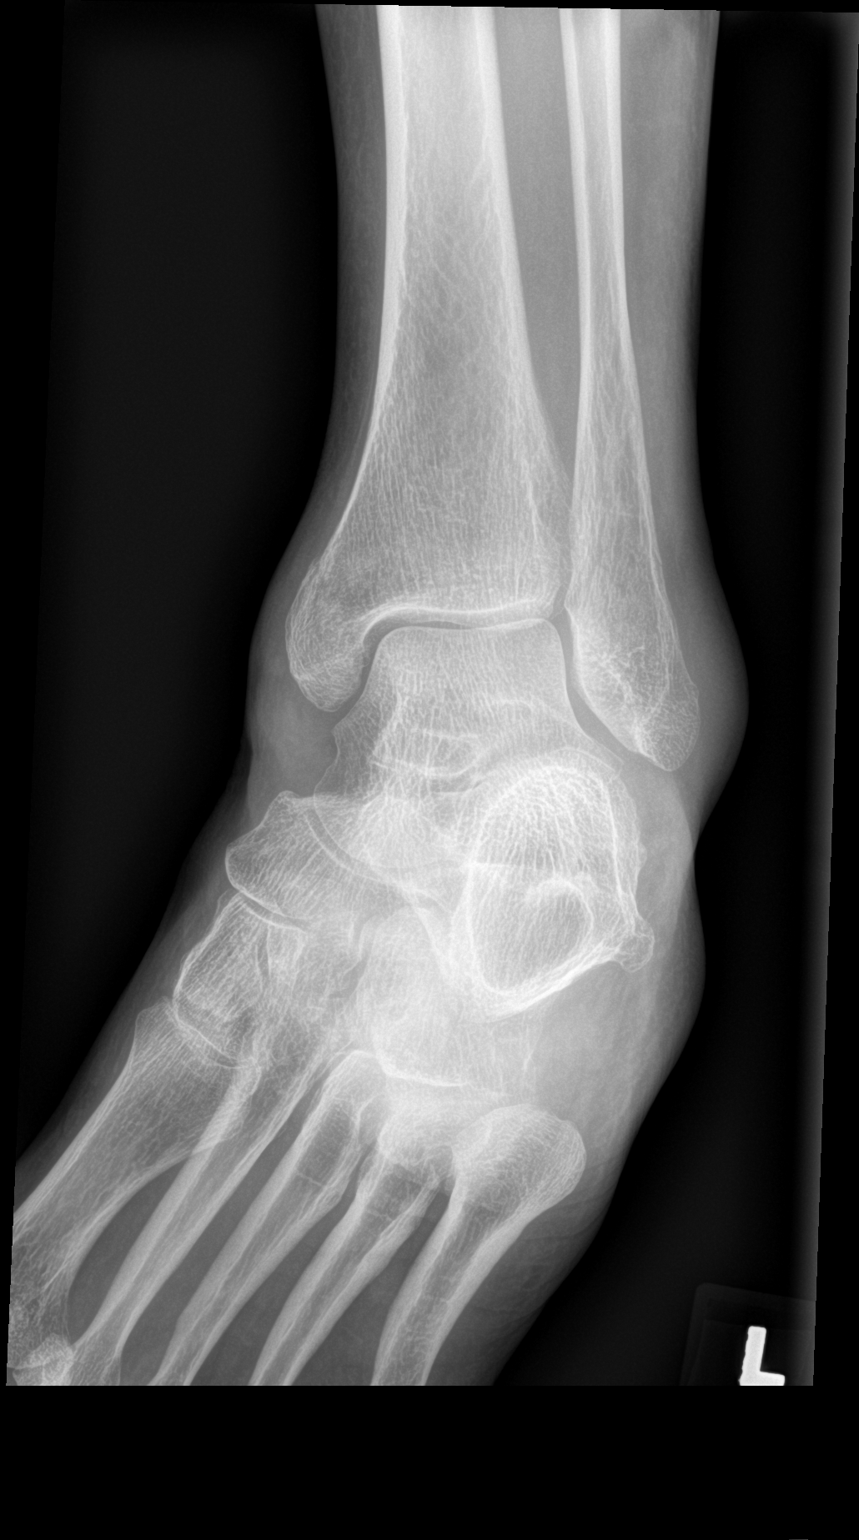

[ankle lat]
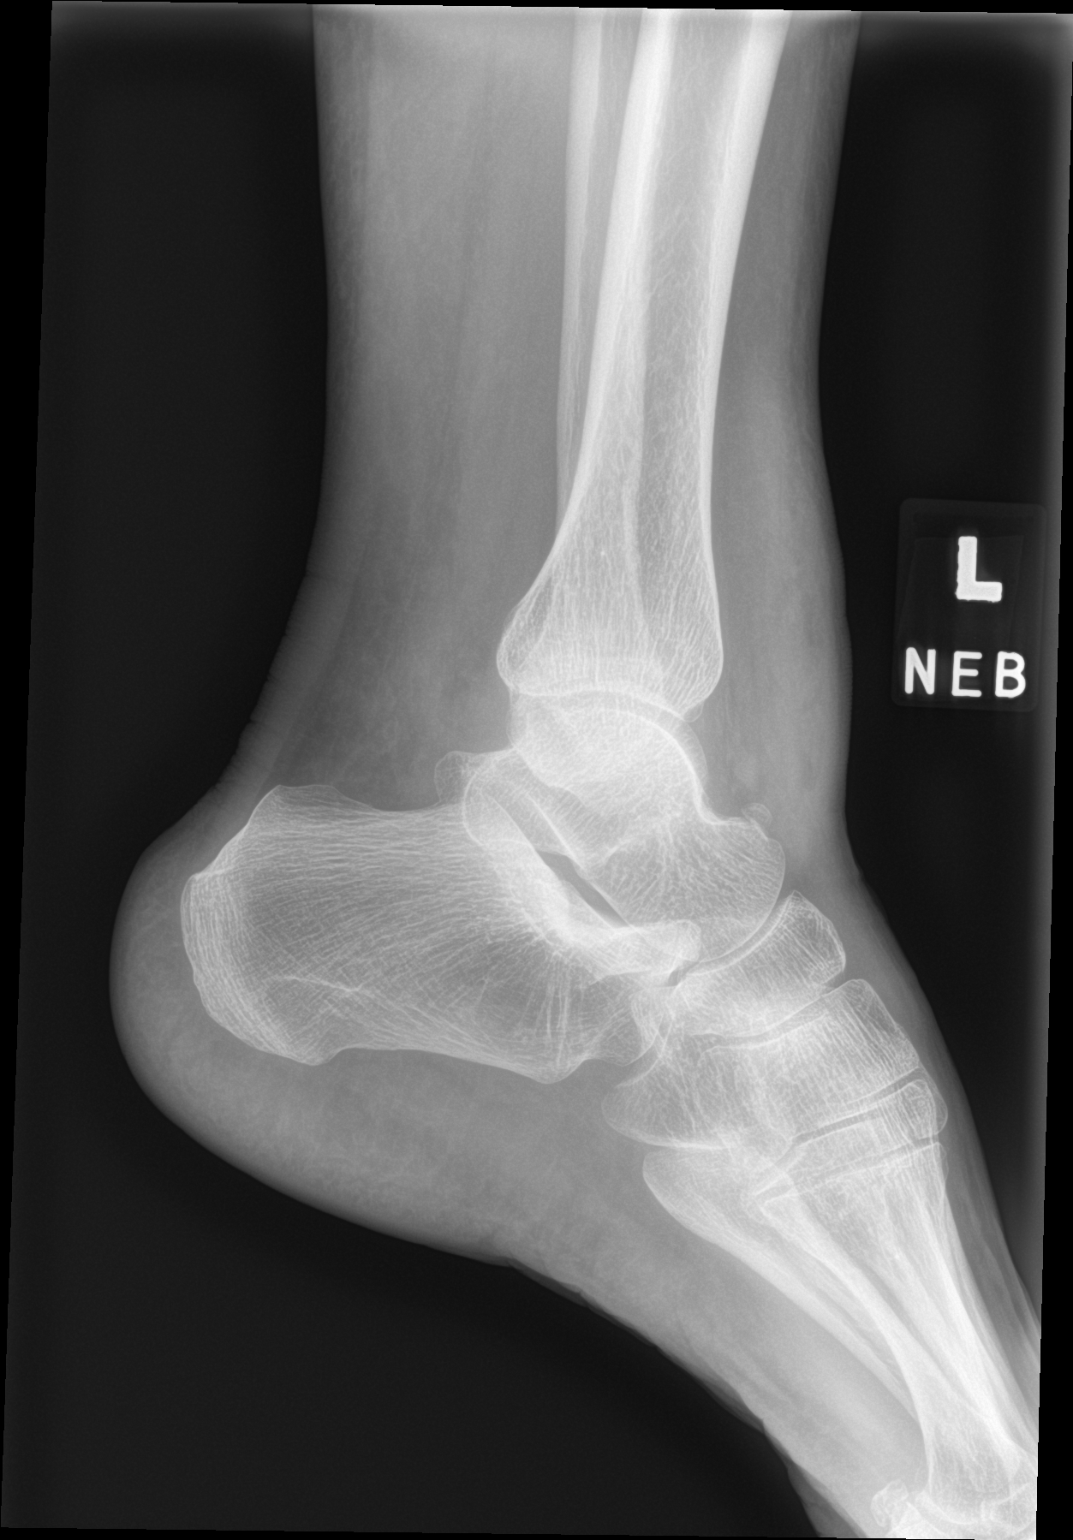

[3 of 3 positions shown; findings below may reference images not displayed]

FINDINGS: There is an acute oblique fracture through the medial malleolus,
nondisplaced.

There is also likely a nondisplaced acute fracture through the
distal fibula just above the level of the ankle mortise.

Joint spaces are well maintained. There is marked soft tissue
swelling surrounding the ankle.
IMPRESSION: 1. Acute nondisplaced medial malleolar fracture.
2. Likely acute nondisplaced fracture of the distal fibula.

## 2022-07-24 DIAGNOSIS — Z0000 Encounter for general adult medical examination without abnormal findings: Secondary | ICD-10-CM | POA: Diagnosis not present

## 2022-07-24 DIAGNOSIS — R002 Palpitations: Secondary | ICD-10-CM | POA: Diagnosis not present

## 2022-07-24 DIAGNOSIS — M810 Age-related osteoporosis without current pathological fracture: Secondary | ICD-10-CM | POA: Diagnosis not present

## 2022-07-24 DIAGNOSIS — F419 Anxiety disorder, unspecified: Secondary | ICD-10-CM | POA: Diagnosis not present

## 2022-07-24 DIAGNOSIS — Z13220 Encounter for screening for lipoid disorders: Secondary | ICD-10-CM | POA: Diagnosis not present

## 2022-07-24 DIAGNOSIS — E890 Postprocedural hypothyroidism: Secondary | ICD-10-CM | POA: Diagnosis not present

## 2022-08-27 ENCOUNTER — Encounter: Payer: Self-pay | Admitting: Orthopaedic Surgery

## 2022-08-27 ENCOUNTER — Ambulatory Visit: Payer: BC Managed Care – PPO | Admitting: Orthopaedic Surgery

## 2022-08-27 VITALS — BP 114/75 | HR 71 | Ht 64.0 in | Wt 125.0 lb

## 2022-08-27 DIAGNOSIS — S8255XD Nondisplaced fracture of medial malleolus of left tibia, subsequent encounter for closed fracture with routine healing: Secondary | ICD-10-CM | POA: Diagnosis not present

## 2022-08-27 DIAGNOSIS — S8254XD Nondisplaced fracture of medial malleolus of right tibia, subsequent encounter for closed fracture with routine healing: Secondary | ICD-10-CM

## 2022-08-27 NOTE — Progress Notes (Signed)
Office Visit Note   Patient: Isabella Khan           Date of Birth: [birth year]           MRN: [id number] Visit Date: 08/27/2022              Requested by: Glenis Smoker, MD [place],  [place] [address] PCP: Glenis Smoker, MD   Assessment & Plan: Visit Diagnoses:  1. Closed nondisplaced fracture of medial malleolus of right tibia with routine healing, subsequent encounter     Plan: Patient is released from care she is happy with results of treatment and is back to normal activity.  Return as needed.  Follow-Up Instructions: No follow-ups on file.   Orders:  No orders of the defined types were placed in this encounter.  No orders of the defined types were placed in this encounter.     Procedures: No procedures performed   Clinical Data: No additional findings.   Subjective: Chief Complaint  Patient presents with   Right Ankle - Fracture    Fall 03/19/2022    HPI 61 year old female 5 months post nondisplaced medial malleolar fracture treated nonoperatively.  Patient just got back from a trip to [place] she states she had minimal problems with with steps or walking but only had problems when she particularly with walking on an incline.  No pain and no instability.  Review of Systems updated unchanged of note is history of maternal DVT.   Objective: Vital Signs: BP 114/75   Pulse 71   Ht 5\' 4"  (1.626 m)   Wt 125 lb (56.7 kg)   BMI 21.46 kg/m   Physical Exam Constitutional:      Appearance: She is well-developed.  HENT:     Head: Normocephalic.     Right Ear: External ear normal.     Left Ear: External ear normal. There is no impacted cerumen.  Eyes:     Pupils: Pupils are equal, round, and reactive to light.  Neck:     Thyroid: No thyromegaly.     Trachea: No tracheal deviation.  Cardiovascular:     Rate and Rhythm: Normal rate.  Pulmonary:     Effort: Pulmonary effort is normal.  Abdominal:     Palpations:  Abdomen is soft.  Musculoskeletal:     Cervical back: No rigidity.  Skin:    General: Skin is warm and dry.  Neurological:     Mental Status: She is alert and oriented to person, place, and time.  Psychiatric:        Behavior: Behavior normal.     Ortho Exam patient has no ankle effusion.  Negative anterior drawer of the right ankle.  Pulses are normal.  She lacks less than 5 degrees symmetrical ankle dorsiflexion.  Ambulation without limp.  She is able to toe walk.  Specialty Comments:  No specialty comments available.  Imaging: No results found.   PMFS History: Patient Active Problem List   Diagnosis Date Noted   History of maternal deep vein thrombosis (DVT) 05/22/2022   Fracture of medial malleolus 03/22/2022   Past Medical History:  Diagnosis Date   Hypothyroid     No family history on file.  No past surgical history on file. Social History   Occupational History   Not on file  Tobacco Use   Smoking status: Never   Smokeless tobacco: Never  Substance and Sexual Activity   Alcohol use: Not on file  Drug use: Not on file   Sexual activity: Not on file

## 2023-02-25 DIAGNOSIS — R82998 Other abnormal findings in urine: Secondary | ICD-10-CM | POA: Diagnosis not present

## 2023-05-12 DIAGNOSIS — E890 Postprocedural hypothyroidism: Secondary | ICD-10-CM | POA: Diagnosis not present

## 2023-06-11 DIAGNOSIS — Z1231 Encounter for screening mammogram for malignant neoplasm of breast: Secondary | ICD-10-CM | POA: Diagnosis not present

## 2024-01-06 DIAGNOSIS — R002 Palpitations: Secondary | ICD-10-CM | POA: Diagnosis not present

## 2024-01-06 DIAGNOSIS — Z23 Encounter for immunization: Secondary | ICD-10-CM | POA: Diagnosis not present

## 2024-01-06 DIAGNOSIS — R5383 Other fatigue: Secondary | ICD-10-CM | POA: Diagnosis not present

## 2024-01-06 DIAGNOSIS — E890 Postprocedural hypothyroidism: Secondary | ICD-10-CM | POA: Diagnosis not present

## 2024-01-06 DIAGNOSIS — F419 Anxiety disorder, unspecified: Secondary | ICD-10-CM | POA: Diagnosis not present
